# Patient Record
Sex: Female | Born: 1995 | Race: White | Hispanic: No | Marital: Single | Smoking: Never smoker
Health system: Southern US, Community
[De-identification: ages and names within clinical notes are randomized; demographics above are authoritative.]

## PROBLEM LIST (undated history)

## (undated) DIAGNOSIS — F32A Depression, unspecified: Secondary | ICD-10-CM

## (undated) DIAGNOSIS — F329 Major depressive disorder, single episode, unspecified: Secondary | ICD-10-CM

---
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
## 2015-01-13 ENCOUNTER — Ambulatory Visit: Payer: Self-pay | Attending: Obstetrics and Gynecology | Admitting: Physical Therapy

## 2015-03-13 ENCOUNTER — Encounter
Admission: RE | Admit: 2015-03-13 | Discharge: 2015-03-13 | Disposition: A | Discharge status: Home / Self Care | Payer: Self-pay | Source: Ambulatory Visit | Attending: Obstetrics and Gynecology | Admitting: Obstetrics and Gynecology

## 2015-03-13 DIAGNOSIS — Z01812 Encounter for preprocedural laboratory examination: Secondary | ICD-10-CM | POA: Insufficient documentation

## 2015-03-13 HISTORY — DX: Major depressive disorder, single episode, unspecified: F32.9

## 2015-03-13 HISTORY — DX: Depression, unspecified: F32.A

## 2015-03-13 LAB — BASIC METABOLIC PANEL
Anion gap: 9 (ref 5–15)
BUN: 14 mg/dL (ref 6–20)
CO2: 25 mmol/L (ref 22–32)
CREATININE: 0.51 mg/dL (ref 0.44–1.00)
Calcium: 9.6 mg/dL (ref 8.9–10.3)
Chloride: 105 mmol/L (ref 101–111)
GFR calc Af Amer: >60 mL/min (ref >60)
Glucose, Bld: 107 mg/dL — ABNORMAL HIGH (ref 65–99)
Potassium: 4.1 mmol/L (ref 3.5–5.1)
SODIUM: 139 mmol/L (ref 135–145)

## 2015-03-13 LAB — CBC
HCT: 38.5 % (ref 35.0–47.0)
Hemoglobin: 12.8 g/dL (ref 12.0–16.0)
MCH: 30.5 pg (ref 26.0–34.0)
MCHC: 33.2 g/dL (ref 32.0–36.0)
MCV: 91.8 fL (ref 80.0–100.0)
PLATELETS: 362 10*3/uL (ref 150–440)
RBC: 4.19 MIL/uL (ref 3.80–5.20)
RDW: 13.1 % (ref 11.5–14.5)
WBC: 5.9 10*3/uL (ref 3.6–11.0)

## 2015-03-13 LAB — TYPE AND SCREEN
ABO/RH(D): A POS
ANTIBODY SCREEN: NEGATIVE

## 2015-03-13 NOTE — H&P (Signed)
Summer Schultz is a 19 y.o. female here for Follow-up .  Followup visit for chronic pelvic pain. Pain began 2 yrs ago after presumed ovarian cyst ruptured, and never has totally resolved.  Pain is pressure, dull. Yesterday was sharp and consistent, not improved with activity. This pattern of changing pain is frequent.  Has failed medical management with extended cycle OCPs, Nuvaring, Nexplanon (placed 10/29/14) which is still in place. I gave her letrozole with add back last visit, no change in pain. She tried for 1-2 months and stopped because of no improvement.   The pain is unrelated to menses, which are heavy with menorrhagia and dysmenorrhea. +pain and bleeding with dyspareunia x3-4 weeks and that has now resolved, and had not happened before.  Bowel: no constipation or diarrhea associated with pain Bladder: sometimes when full bladder pain hurts differently, but no pain with urination. MSK: abdominal wall is relaxed, with no trigger points noted. Vaginal exam is deferred at patient's request, as she desires under anesthesia.  Ultrasound on 09/24/14: Uterus and bilateral ovaries within normal limits.  Denies hx of sexual or other abuse.  Past Medical History:  has a past medical history of Depression; Abnormal uterine bleeding; Dysmenorrhea; Anxiety; Dyspareunia (12/21/2014); and Endometriosis of uterus.  Past Surgical History:  has past surgical history that includes Extraction Teeth. Family History: family history includes No Known Problems in her mother. Social History:  reports that she has never smoked. She does not have any smokeless tobacco history on file. She reports that she does not drink alcohol or use illicit drugs. OB/GYN History:  OB History    Gravida Para Term Preterm AB TAB SAB Ectopic Multiple Living   0 0 0 0 0 0 0 0 0 0       Allergies: has No Known Allergies. Medications:  Current outpatient prescriptions:  . citalopram (CELEXA)  40 MG tablet, Take 40 mg by mouth once daily., Disp: , Rfl:  . etonogestrel (NEXPLANON) 68 mg implant, Inject 1 each into the skin once., Disp: , Rfl:  . HYDROcodone-acetaminophen (NORCO) 5-325 mg tablet, Take 1 tablet by mouth every 4 (four) hours as needed for Pain. Take prior to appointment, Disp: 2 tablet, Rfl: 0 . letrozole (FEMARA) 2.5 mg tablet, Take 2 tablets (5 mg total) by mouth once daily., Disp: 60 tablet, Rfl: 1 . norethindrone (AYGESTIN) 5 mg tablet, Take 1 tablet (5 mg total) by mouth once daily., Disp: 30 tablet, Rfl: 1 . traMADol (ULTRAM) 50 mg tablet, Take 1 tablet (50 mg total) by mouth every 6 (six) hours as needed for Pain., Disp: 30 tablet, Rfl: 1   Review of Systems: General: No fatigue or weight loss Eyes:No vision changes Ears:No hearing difficulty Respiratory: No cough or shortness of breath Pulmonary: No asthma or shortness of breath Cardiovascular: No chest pain, palpitations, dyspnea on exertion Gastrointestinal: No abdominal bloating, chronic diarrhea, constipations, masses, pain or hematochezia Genitourinary:No hematuria, dysuria, abnormal vaginal discharge,  Lymphatic:No swollen lymph nodes Musculoskeletal:No muscle weakness Neurologic:No extremity weakness, syncope, seizure disorder Psychiatric:No history of depression, delusions or suicidal/homicidal ideation   Exam:   Filed Vitals:   02/28/15 1116  BP: 123/66  Pulse: 94   Body mass index is 29.04 kg/(m^2).  WDWN white female in NAD Lungs: CTA  CV : RRR without murmur  Breast: deferred Neck: no thyromegaly Abdomen: soft , no mass, normal active bowel sounds, non-tender, no rebound tenderness, no point tenderness noted Skin: No rashes, ulcers or skin lesions noted. No  excessive hirsutism or  acne noted.  Neurological: Appears alert and oriented and is a good historian. No gross abnormalities are noted. Psychological: Normal affect and mood. No signs of anxiety or depression noted.   Pelvic:  Deferred at patient's request- no norco taken    Impression:   The encounter diagnosis was Pelvic pain in female.    Plan:   - Pelvic pain: Has failed medical management, and endometriosis seems less likely. Pt was given the option to trial a neuromodulator, go to pelvic floor physical therapy, be referred [facility], or have comprehensive exam under anesthesia, dx lap with cystoscopy here. She elects for surgery here, realizing that findings that explain all her sx are unlikely. If no findings and no improvement, will refer to [facility].  At time of surgery, will send straight cath urine for UA/analysis to evaluate for infection, and pre-admit testing labs will include clean catch for hemauria.

## 2015-03-13 NOTE — Patient Instructions (Signed)
  Your procedure is scheduled on: Friday Sept. 16, 2016. Report to Same Day Surgery. To find out your arrival time please call [phone] between 1PM - 3PM on Thursday Sept. Thursday Sept 15, 2016.  Remember: Instructions that are not followed completely may result in serious medical risk, up to and including death, or upon the discretion of your surgeon and anesthesiologist your surgery may need to be rescheduled.    __x__ 1. Do not eat food or drink liquids after midnight. No gum chewing or hard candies.     ____ 2. No Alcohol for 24 hours before or after surgery.   ____ 3. Bring all medications with you on the day of surgery if instructed.    __x__ 4. Notify your doctor if there is any change in your medical condition     (cold, fever, infections).     Do not wear jewelry, make-up, hairpins, clips or nail polish.  Do not wear lotions, powders, or perfumes. You may wear deodorant.  Do not shave 48 hours prior to surgery. Men may shave face and neck.  Do not bring valuables to the hospital.    [facility] is not responsible for any belongings or valuables.               Contacts, dentures or bridgework may not be worn into surgery.  Leave your suitcase in the car. After surgery it may be brought to your room.  For patients admitted to the hospital, discharge time is determined by your treatment team.   Patients discharged the day of surgery will not be allowed to drive home.    Please read over the following fact sheets that you were given:   [facility] Preparing for Surgery  __x__ Take these medicines the morning of surgery with A SIP OF WATER:    1. citalopram (CELEXA)   ____ Fleet Enema (as directed)   _x_ Use CHG Soap as directed  ____ Use inhalers on the day of surgery  ____ Stop metformin 2 days prior to surgery    ____ Take 1/2 of usual insulin dose the night before surgery and none on the morning of surgery.   ____ Stop Coumadin/Plavix/aspirin on Does not  apply.  __x__ Stop Anti-inflammatories/Ibuprofen now.  Tylenol OK for pain.   ____ Stop supplements until after surgery.    ____ Bring C-Pap to the hospital.

## 2015-03-13 NOTE — Pre-Procedure Instructions (Signed)
Leah in OR notified of pt's port a cath at 1630.

## 2015-03-14 LAB — ABO/RH: ABO/RH(D): A POS

## 2015-03-21 ENCOUNTER — Ambulatory Visit: Payer: Self-pay | Admitting: Anesthesiology

## 2015-03-21 ENCOUNTER — Encounter
Admission: RE | Disposition: A | Discharge status: Home / Self Care | Payer: Self-pay | Source: Ambulatory Visit | Attending: Obstetrics and Gynecology

## 2015-03-21 ENCOUNTER — Ambulatory Visit
Admission: RE | Admit: 2015-03-21 | Discharge: 2015-03-21 | Disposition: A | Discharge status: Home / Self Care | Payer: Self-pay | Source: Ambulatory Visit | Attending: Obstetrics and Gynecology | Admitting: Obstetrics and Gynecology

## 2015-03-21 ENCOUNTER — Encounter: Payer: Self-pay | Admitting: *Deleted

## 2015-03-21 DIAGNOSIS — N946 Dysmenorrhea, unspecified: Secondary | ICD-10-CM | POA: Insufficient documentation

## 2015-03-21 DIAGNOSIS — N3289 Other specified disorders of bladder: Secondary | ICD-10-CM | POA: Insufficient documentation

## 2015-03-21 DIAGNOSIS — R102 Pelvic and perineal pain: Secondary | ICD-10-CM | POA: Insufficient documentation

## 2015-03-21 DIAGNOSIS — N938 Other specified abnormal uterine and vaginal bleeding: Secondary | ICD-10-CM | POA: Insufficient documentation

## 2015-03-21 DIAGNOSIS — F329 Major depressive disorder, single episode, unspecified: Secondary | ICD-10-CM | POA: Insufficient documentation

## 2015-03-21 DIAGNOSIS — G8929 Other chronic pain: Secondary | ICD-10-CM | POA: Insufficient documentation

## 2015-03-21 DIAGNOSIS — F419 Anxiety disorder, unspecified: Secondary | ICD-10-CM | POA: Insufficient documentation

## 2015-03-21 DIAGNOSIS — N941 Dyspareunia: Secondary | ICD-10-CM | POA: Insufficient documentation

## 2015-03-21 DIAGNOSIS — Z79899 Other long term (current) drug therapy: Secondary | ICD-10-CM | POA: Insufficient documentation

## 2015-03-21 HISTORY — PX: LAPAROSCOPY: SHX197

## 2015-03-21 HISTORY — PX: CYSTOSCOPY: SHX5120

## 2015-03-21 LAB — URINALYSIS COMPLETE WITH MICROSCOPIC (ARMC ONLY)
BACTERIA UA: NONE SEEN
BILIRUBIN URINE: NEGATIVE
GLUCOSE, UA: NEGATIVE mg/dL
HGB URINE DIPSTICK: NEGATIVE
Ketones, ur: NEGATIVE mg/dL
LEUKOCYTES UA: NEGATIVE
NITRITE: NEGATIVE
Protein, ur: NEGATIVE mg/dL
SPECIFIC GRAVITY, URINE: 1.009 (ref 1.005–1.030)
Squamous Epithelial / LPF: NONE SEEN
WBC, UA: NONE SEEN WBC/hpf (ref 0–5)
pH: 6 (ref 5.0–8.0)

## 2015-03-21 LAB — POCT PREGNANCY, URINE: Preg Test, Ur: NEGATIVE

## 2015-03-21 SURGERY — LAPAROSCOPY, DIAGNOSTIC
Anesthesia: General

## 2015-03-21 MED ORDER — FENTANYL CITRATE (PF) 100 MCG/2ML IJ SOLN
INTRAMUSCULAR | Status: DC | PRN
  Administered 2015-03-21 (×2): 50 ug via INTRAVENOUS

## 2015-03-21 MED ORDER — ROCURONIUM BROMIDE 100 MG/10ML IV SOLN
INTRAVENOUS | Status: DC | PRN
  Administered 2015-03-21: 25 mg via INTRAVENOUS

## 2015-03-21 MED ORDER — OXYCODONE-ACETAMINOPHEN 5-325 MG PO TABS: 1.0000 | ORAL_TABLET | Freq: Four times a day (QID) | ORAL | Status: AC | PRN

## 2015-03-21 MED ORDER — ONDANSETRON HCL 4 MG/2ML IJ SOLN
INTRAMUSCULAR | Status: DC | PRN
  Administered 2015-03-21: 4 mg via INTRAVENOUS

## 2015-03-21 MED ORDER — LIDOCAINE HCL (CARDIAC) 20 MG/ML IV SOLN
INTRAVENOUS | Status: DC | PRN
  Administered 2015-03-21: 40 mg via INTRAVENOUS

## 2015-03-21 MED ORDER — ONDANSETRON HCL 4 MG/2ML IJ SOLN: 4.0000 mg | Freq: Once | INTRAMUSCULAR | Status: DC | PRN

## 2015-03-21 MED ORDER — BUPIVACAINE HCL 0.5 % IJ SOLN
INTRAMUSCULAR | Status: DC | PRN
  Administered 2015-03-21: 10 mL

## 2015-03-21 MED ORDER — IBUPROFEN 800 MG PO TABS: 800.0000 mg | ORAL_TABLET | Freq: Three times a day (TID) | ORAL | Status: AC | PRN

## 2015-03-21 MED ORDER — BUPIVACAINE HCL (PF) 0.5 % IJ SOLN
INTRAMUSCULAR | Status: AC
  Filled 2015-03-21: qty 30

## 2015-03-21 MED ORDER — LACTATED RINGERS IV SOLN
INTRAVENOUS | Status: DC
  Administered 2015-03-21 (×3): via INTRAVENOUS

## 2015-03-21 MED ORDER — LACTATED RINGERS IR SOLN
Status: DC | PRN
  Administered 2015-03-21: 750 mL

## 2015-03-21 MED ORDER — LACTATED RINGERS IV SOLN: INTRAVENOUS | Status: DC

## 2015-03-21 MED ORDER — PROPOFOL 10 MG/ML IV BOLUS
INTRAVENOUS | Status: DC | PRN
  Administered 2015-03-21: 150 mg via INTRAVENOUS

## 2015-03-21 MED ORDER — FENTANYL CITRATE (PF) 100 MCG/2ML IJ SOLN
INTRAMUSCULAR | Status: AC
  Administered 2015-03-21: 25 ug via INTRAVENOUS
  Filled 2015-03-21: qty 2

## 2015-03-21 MED ORDER — DEXAMETHASONE SODIUM PHOSPHATE 4 MG/ML IJ SOLN
INTRAMUSCULAR | Status: DC | PRN
  Administered 2015-03-21: 5 mg via INTRAVENOUS

## 2015-03-21 MED ORDER — FAMOTIDINE 20 MG PO TABS
ORAL_TABLET | ORAL | Status: AC
  Administered 2015-03-21: 20 mg via ORAL
  Filled 2015-03-21: qty 1

## 2015-03-21 MED ORDER — FENTANYL CITRATE (PF) 100 MCG/2ML IJ SOLN
25.0000 ug | INTRAMUSCULAR | Status: DC | PRN
Start: 2015-03-21 — End: 2015-03-21
  Administered 2015-03-21 (×4): 25 ug via INTRAVENOUS

## 2015-03-21 MED ORDER — MIDAZOLAM HCL 2 MG/2ML IJ SOLN
INTRAMUSCULAR | Status: DC | PRN
  Administered 2015-03-21 (×2): 2 mg via INTRAVENOUS

## 2015-03-21 MED ORDER — KETOROLAC TROMETHAMINE 30 MG/ML IJ SOLN
INTRAMUSCULAR | Status: DC | PRN
  Administered 2015-03-21: 30 mg via INTRAVENOUS

## 2015-03-21 MED ORDER — FAMOTIDINE 20 MG PO TABS
20.0000 mg | ORAL_TABLET | Freq: Once | ORAL | Status: AC
  Administered 2015-03-21: 20 mg via ORAL

## 2015-03-21 SURGICAL SUPPLY — 39 items
BAG URO DRAIN 2000ML W/SPOUT (MISCELLANEOUS) IMPLANT
BLADE SURG SZ11 CARB STEEL (BLADE) ×3 IMPLANT
CATH FOLEY 2WAY  5CC 16FR (CATHETERS) ×2
CATH ROBINSON RED A/P 16FR (CATHETERS) ×3 IMPLANT
CATH URTH 16FR FL 2W BLN LF (CATHETERS) ×1 IMPLANT
CHLORAPREP W/TINT 26ML (MISCELLANEOUS) ×3 IMPLANT
CLOSURE WOUND 1/4X4 (GAUZE/BANDAGES/DRESSINGS) ×1
DRSG TEGADERM 2-3/8X2-3/4 SM (GAUZE/BANDAGES/DRESSINGS) ×3 IMPLANT
ENDOPOUCH RETRIEVER 10 (MISCELLANEOUS) IMPLANT
GAUZE SPONGE NON-WVN 2X2 STRL (MISCELLANEOUS) ×1 IMPLANT
GLOVE BIO SURGEON STRL SZ 6.5 (GLOVE) ×8 IMPLANT
GLOVE BIO SURGEONS STRL SZ 6.5 (GLOVE) ×4
GLOVE INDICATOR 7.0 STRL GRN (GLOVE) ×12 IMPLANT
GOWN STRL REUS W/ TWL LRG LVL3 (GOWN DISPOSABLE) ×2 IMPLANT
GOWN STRL REUS W/TWL LRG LVL3 (GOWN DISPOSABLE) ×4
IRRIGATION STRYKERFLOW (MISCELLANEOUS) IMPLANT
IRRIGATOR STRYKERFLOW (MISCELLANEOUS)
IV LACTATED RINGERS 1000ML (IV SOLUTION) ×3 IMPLANT
KIT RM TURNOVER CYSTO AR (KITS) ×3 IMPLANT
LABEL OR SOLS (LABEL) IMPLANT
LIQUID BAND (GAUZE/BANDAGES/DRESSINGS) ×3 IMPLANT
NS IRRIG 500ML POUR BTL (IV SOLUTION) ×3 IMPLANT
PACK GYN LAPAROSCOPIC (MISCELLANEOUS) ×3 IMPLANT
PAD OB MATERNITY 4.3X12.25 (PERSONAL CARE ITEMS) ×3 IMPLANT
PAD PREP 24X41 OB/GYN DISP (PERSONAL CARE ITEMS) ×3 IMPLANT
SET CYSTO W/LG BORE CLAMP LF (SET/KITS/TRAYS/PACK) ×3 IMPLANT
SHEARS HARMONIC ACE PLUS 36CM (ENDOMECHANICALS) IMPLANT
SLEEVE ENDOPATH XCEL 5M (ENDOMECHANICALS) ×3 IMPLANT
SPONGE VERSALON 2X2 STRL (MISCELLANEOUS) ×2
STRIP CLOSURE SKIN 1/4X4 (GAUZE/BANDAGES/DRESSINGS) ×2 IMPLANT
SURGILUBE 2OZ TUBE FLIPTOP (MISCELLANEOUS) ×3 IMPLANT
SUT VIC AB 2-0 UR6 27 (SUTURE) ×3 IMPLANT
SUT VIC AB 4-0 SH 27 (SUTURE) ×2
SUT VIC AB 4-0 SH 27XANBCTRL (SUTURE) ×1 IMPLANT
SWABSTK COMLB BENZOIN TINCTURE (MISCELLANEOUS) IMPLANT
TROCAR ENDO BLADELESS 11MM (ENDOMECHANICALS) IMPLANT
TROCAR XCEL NON-BLD 5MMX100MML (ENDOMECHANICALS) ×3 IMPLANT
TROCAR XCEL UNIV SLVE 11M 100M (ENDOMECHANICALS) IMPLANT
TUBING INSUFFLATOR HI FLOW (MISCELLANEOUS) ×3 IMPLANT

## 2015-03-21 NOTE — Anesthesia Preprocedure Evaluation (Signed)
Anesthesia Evaluation  Patient identified by MRN, date of birth, ID band Patient awake    Reviewed: Allergy & Precautions, NPO status , Patient's Chart, lab work & pertinent test results, reviewed documented beta blocker date and time   Airway Mallampati: II  TM Distance: >3 FB     Dental  (+) Chipped   Pulmonary           Cardiovascular      Neuro/Psych PSYCHIATRIC DISORDERS Depression    GI/Hepatic   Endo/Other    Renal/GU      Musculoskeletal   Abdominal   Peds  Hematology   Anesthesia Other Findings   Reproductive/Obstetrics                             Anesthesia Physical Anesthesia Plan  ASA: II  Anesthesia Plan: General   Post-op Pain Management:    Induction: Intravenous  Airway Management Planned: Oral ETT  Additional Equipment:   Intra-op Plan:   Post-operative Plan:   Informed Consent: I have reviewed the patients History and Physical, chart, labs and discussed the procedure including the risks, benefits and alternatives for the proposed anesthesia with the patient or authorized representative who has indicated his/her understanding and acceptance.     Plan Discussed with: CRNA  Anesthesia Plan Comments:         Anesthesia Quick Evaluation  

## 2015-03-21 NOTE — Anesthesia Postprocedure Evaluation (Signed)
  Anesthesia Post-op Note  Patient: Summer Schultz  Procedure(s) Performed: Procedure(s): LAPAROSCOPY DIAGNOSTIC (N/A) CYSTOSCOPY with hydrodistention (N/A)  Anesthesia type:General  Patient location: PACU  Post pain: Pain level controlled  Post assessment: Post-op Vital signs reviewed, Patient's Cardiovascular Status Stable, Respiratory Function Stable, Patent Airway and No signs of Nausea or vomiting  Post vital signs: Reviewed and stable  Last Vitals:  Filed Vitals:   03/21/15 1112  BP: 118/62  Pulse: 82  Temp:   Resp: 16    Level of consciousness: awake, alert  and patient cooperative  Complications: No apparent anesthesia complications

## 2015-03-21 NOTE — Transfer of Care (Signed)
Immediate Anesthesia Transfer of Care Note  Patient: Summer Schultz  Procedure(s) Performed: Procedure(s): LAPAROSCOPY DIAGNOSTIC (N/A) CYSTOSCOPY with hydrodistention (N/A)  Patient Location: PACU  Anesthesia Type:General  Level of Consciousness: awake, alert  and sedated  Airway & Oxygen Therapy: Patient Spontanous Breathing and Patient connected to face mask oxygen  Post-op Assessment: Report given to RN and Post -op Vital signs reviewed and stable  Post vital signs: Reviewed and stable  Last Vitals:  Filed Vitals:   03/21/15 0944  BP: 139/71  Pulse: 116  Temp: 37.4 C  Resp: 19    Complications: No apparent anesthesia complications

## 2015-03-21 NOTE — Op Note (Signed)
Summer Schultz PROCEDURE DATE: 03/21/2015  PREOPERATIVE DIAGNOSIS: Chronic pelvic pain POSTOPERATIVE DIAGNOSIS: Chronic pelvic pain, concerns for interstitial cystitis PROCEDURE: Diagnostic laparoscopy with pelvic washings, cystoscopy with hydrodistention SURGEON:  Dr. Christeen Douglas ASSISTANT: Scrub Tech: [name], CST  INDICATIONS: 19 y.o. G0 with history of chronic pelvic pain desiring surgical evaluation.   Please see preoperative notes for further details.  Risks of surgery were discussed with the patient including but not limited to: bleeding which may require transfusion or reoperation; infection which may require antibiotics; injury to bowel, bladder, ureters or other surrounding organs; need for additional procedures including laparotomy; thromboembolic phenomenon, incisional problems and other postoperative/anesthesia complications. Written informed consent was obtained.    FINDINGS:  Small retroverted uterus, normal ovaries and fallopian tubes bilaterally.  There is no evidence of endometriosis in the pelvis. No other abdominal/pelvic abnormality.  Normal upper abdomen. Cystoscopy after hydrodistention with bleeding ulcers noted.  ANESTHESIA:    General INTRAVENOUS FLUIDS: 1000 ml ESTIMATED BLOOD LOSS: 10 ml URINE OUTPUT: 200 ml SPECIMENS: Peritoneal washings, urine culture COMPLICATIONS: None immediate  PROCEDURE IN DETAIL:  The patient had sequential compression devices applied to her lower extremities while in the preoperative area.  She was then taken to the operating room where general anesthesia was administered and was found to be adequate.  She was placed in the dorsal lithotomy position, and was prepped and draped in a sterile manner.  A straight catheter was inserted into her bladder and a sponge stick placed in the vagina. After an adequate timeout was performed, attention was turned to the abdomen where an umbilical incision was made with the scalpel.  The Optiview  5-mm trocar and sleeve were then advanced without difficulty with the laparoscope under direct visualization into the abdomen.  The abdomen was then insufflated with carbon dioxide gas and adequate pneumoperitoneum was obtained.  A detailed survey of the patient's pelvis and abdomen revealed the findings as mentioned above.   The operative site was surveyed, and it was found to be hemostatic.  No intraoperative injury to surrounding organs was noted.  The abdomen was desufflated and all instruments were then removed from the patient's abdomen.  All incisions were closed with Dermabond and 4-0 Monocryl.   CYSTOSCOPY: An uncomplicated cystoscopy was performed with pictures taken. of hydrodistention used to inflate the bladder. On draining, bleeding vessels were noted and pictures taken. Excellent efflux was noted from both ureteral orifices.  The patient tolerated the procedures well.  All instruments, needles, and sponge counts were correct x 2. The patient was taken to the recovery room in stable condition.

## 2015-03-21 NOTE — Discharge Instructions (Signed)

## 2015-03-21 NOTE — Interval H&P Note (Signed)
History and Physical Interval Note:  03/21/2015 8:13 AM  Summer Schultz  has presented today for surgery, with the diagnosis of CHRONIC PELVIC PAIN  The various methods of treatment have been discussed with the patient and family. After consideration of risks, benefits and other options for treatment, the patient has consented to  Procedure(s): LAPAROSCOPY DIAGNOSTIC (N/A) CYSTOSCOPY (N/A) as a surgical intervention .  The patient's history has been reviewed, patient examined, no change in status, stable for surgery.  I have reviewed the patient's chart and labs.  Questions were answered to the patient's satisfaction.     Christeen Douglas

## 2015-03-21 NOTE — Anesthesia Procedure Notes (Signed)
Procedure Name: Intubation Date/Time: 03/21/2015 8:24 AM Performed by: Tonia Ghent Pre-anesthesia Checklist: Emergency Drugs available, Patient identified, Suction available, Patient being monitored and Timeout performed Patient Re-evaluated:Patient Re-evaluated prior to inductionOxygen Delivery Method: Circle system utilized Preoxygenation: Pre-oxygenation with 100% oxygen Intubation Type: IV induction Ventilation: Mask ventilation without difficulty Laryngoscope Size: 3 and Miller Grade View: Grade I Tube type: Oral Tube size: 6.5 mm Number of attempts: 1 Airway Equipment and Method: Stylet Placement Confirmation: ETT inserted through vocal cords under direct vision,  positive ETCO2,  CO2 detector and breath sounds checked- equal and bilateral Secured at: 20 cm Tube secured with: Tape

## 2015-03-23 LAB — URINE CULTURE: Culture: NO GROWTH

## 2015-03-24 LAB — CYTOLOGY - NON PAP

## 2017-08-22 ENCOUNTER — Ambulatory Visit
Admission: RE | Admit: 2017-08-22 | Discharge: 2017-08-22 | Disposition: A | Discharge status: Home / Self Care | Payer: Self-pay | Source: Ambulatory Visit | Attending: Oncology | Admitting: Oncology

## 2017-08-22 ENCOUNTER — Ambulatory Visit: Payer: Self-pay | Attending: Oncology

## 2017-08-22 ENCOUNTER — Ambulatory Visit: Payer: Self-pay

## 2017-08-22 VITALS — BP 112/76 | HR 81 | Temp 98.3°F | Ht 62.0 in | Wt 179.0 lb

## 2017-08-22 DIAGNOSIS — N63 Unspecified lump in unspecified breast: Secondary | ICD-10-CM

## 2017-08-22 NOTE — Progress Notes (Signed)
Radiologist discussed benign findings with patient.  She is to return for annual mammograms at age 22.  Copy to [facility].

## 2017-08-22 NOTE — Progress Notes (Signed)
Subjective:     Patient ID: Summer Schultz, female   DOB: 10/03/1995, 22 y.o.   MRN: [id number]  HPI   Review of Systems     Objective:   Physical Exam  Pulmonary/Chest:    Firm, mobile, 1 cm. Nodule, 4 cm. from areola, left breast       Assessment:     22 [facility].  Patient has a left breast lump x 5 years.  States at times it is more tender, but has not grown.  Patient screened, and meets [facility] eligibility.  Patient does not have insurance, Medicare or Medicaid.  Handout given on Affordable Care Act. Instructed patient on breast self awareness using teach back method.  Palpated a firm nodule left breast 4 cm. from areola.  Otherwise normal breast exam.  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    Plan:     Sent for left breast ultrasound.

## 2017-08-29 ENCOUNTER — Ambulatory Visit: Payer: Self-pay
